# Patient Record
Sex: Male | Born: 1970 | Race: White | Hispanic: No | Marital: Married | Smoking: Never smoker
Health system: Southern US, Community
[De-identification: ages and names within clinical notes are randomized; demographics above are authoritative.]

## PROBLEM LIST (undated history)

## (undated) DIAGNOSIS — E119 Type 2 diabetes mellitus without complications: Secondary | ICD-10-CM

## (undated) HISTORY — PX: HERNIA REPAIR: SHX51

---

## 2015-07-22 DIAGNOSIS — F1020 Alcohol dependence, uncomplicated: Secondary | ICD-10-CM | POA: Insufficient documentation

## 2018-03-15 ENCOUNTER — Emergency Department (HOSPITAL_COMMUNITY): Payer: 59

## 2018-03-15 ENCOUNTER — Other Ambulatory Visit: Payer: Self-pay

## 2018-03-15 ENCOUNTER — Encounter (HOSPITAL_COMMUNITY): Payer: Self-pay | Admitting: Emergency Medicine

## 2018-03-15 ENCOUNTER — Observation Stay (HOSPITAL_COMMUNITY)
Admission: EM | Admit: 2018-03-15 | Discharge: 2018-03-16 | Disposition: A | Discharge status: Home / Self Care | Payer: 59 | Attending: Family Medicine | Admitting: Family Medicine

## 2018-03-15 DIAGNOSIS — Z791 Long term (current) use of non-steroidal anti-inflammatories (NSAID): Secondary | ICD-10-CM | POA: Insufficient documentation

## 2018-03-15 DIAGNOSIS — I8500 Esophageal varices without bleeding: Secondary | ICD-10-CM | POA: Diagnosis not present

## 2018-03-15 DIAGNOSIS — E119 Type 2 diabetes mellitus without complications: Secondary | ICD-10-CM | POA: Diagnosis not present

## 2018-03-15 DIAGNOSIS — R058 Other specified cough: Secondary | ICD-10-CM

## 2018-03-15 DIAGNOSIS — Z79899 Other long term (current) drug therapy: Secondary | ICD-10-CM | POA: Diagnosis not present

## 2018-03-15 DIAGNOSIS — K529 Noninfective gastroenteritis and colitis, unspecified: Principal | ICD-10-CM | POA: Diagnosis present

## 2018-03-15 DIAGNOSIS — R05 Cough: Secondary | ICD-10-CM | POA: Insufficient documentation

## 2018-03-15 DIAGNOSIS — R109 Unspecified abdominal pain: Secondary | ICD-10-CM

## 2018-03-15 DIAGNOSIS — Z7984 Long term (current) use of oral hypoglycemic drugs: Secondary | ICD-10-CM | POA: Insufficient documentation

## 2018-03-15 HISTORY — DX: Type 2 diabetes mellitus without complications: E11.9

## 2018-03-15 LAB — COMPREHENSIVE METABOLIC PANEL
ALT: 46 U/L — ABNORMAL HIGH (ref 0–44)
ANION GAP: 9 (ref 5–15)
AST: 41 U/L (ref 15–41)
Albumin: 3.9 g/dL (ref 3.5–5.0)
Alkaline Phosphatase: 118 U/L (ref 38–126)
BUN: 6 mg/dL (ref 6–20)
CO2: 24 mmol/L (ref 22–32)
Calcium: 9.4 mg/dL (ref 8.9–10.3)
Chloride: 104 mmol/L (ref 98–111)
Creatinine, Ser: 0.74 mg/dL (ref 0.61–1.24)
GFR calc non Af Amer: >60 mL/min (ref >60)
Glucose, Bld: 142 mg/dL — ABNORMAL HIGH (ref 70–99)
Potassium: 4 mmol/L (ref 3.5–5.1)
Sodium: 137 mmol/L (ref 135–145)
Total Bilirubin: 1 mg/dL (ref 0.3–1.2)
Total Protein: 7.9 g/dL (ref 6.5–8.1)

## 2018-03-15 LAB — CBC WITH DIFFERENTIAL/PLATELET
Abs Immature Granulocytes: 0.04 10*3/uL (ref 0.00–0.07)
Basophils Absolute: 0.1 10*3/uL (ref 0.0–0.1)
Basophils Relative: 1 %
Eosinophils Absolute: 0.2 10*3/uL (ref 0.0–0.5)
Eosinophils Relative: 3 %
HCT: 49.3 % (ref 39.0–52.0)
Hemoglobin: 16.6 g/dL (ref 13.0–17.0)
Immature Granulocytes: 1 %
Lymphocytes Relative: 20 %
Lymphs Abs: 1.7 10*3/uL (ref 0.7–4.0)
MCH: 31.7 pg (ref 26.0–34.0)
MCHC: 33.7 g/dL (ref 30.0–36.0)
MCV: 94.3 fL (ref 80.0–100.0)
Monocytes Absolute: 1.1 10*3/uL — ABNORMAL HIGH (ref 0.1–1.0)
Monocytes Relative: 13 %
Neutro Abs: 5.5 10*3/uL (ref 1.7–7.7)
Neutrophils Relative %: 62 %
Platelets: 239 10*3/uL (ref 150–400)
RBC: 5.23 MIL/uL (ref 4.22–5.81)
RDW: 12.2 % (ref 11.5–15.5)
WBC: 8.7 10*3/uL (ref 4.0–10.5)
nRBC: 0 % (ref 0.0–0.2)

## 2018-03-15 LAB — URINALYSIS, ROUTINE W REFLEX MICROSCOPIC
BILIRUBIN URINE: NEGATIVE
Glucose, UA: NEGATIVE mg/dL
Hgb urine dipstick: NEGATIVE
Ketones, ur: NEGATIVE mg/dL
Leukocytes, UA: NEGATIVE
Nitrite: NEGATIVE
Protein, ur: NEGATIVE mg/dL
Specific Gravity, Urine: >1.046 — ABNORMAL HIGH (ref 1.005–1.030)
pH: 5 (ref 5.0–8.0)

## 2018-03-15 LAB — LIPASE, BLOOD: LIPASE: 95 U/L — AB (ref 11–51)

## 2018-03-15 MED ORDER — ALBUTEROL SULFATE (2.5 MG/3ML) 0.083% IN NEBU
2.5000 mg | INHALATION_SOLUTION | Freq: Two times a day (BID) | RESPIRATORY_TRACT | Status: DC
  Administered 2018-03-16: 2.5 mg via RESPIRATORY_TRACT

## 2018-03-15 MED ORDER — DICYCLOMINE HCL 10 MG PO CAPS
10.0000 mg | ORAL_CAPSULE | Freq: Three times a day (TID) | ORAL | Status: DC
  Administered 2018-03-16 (×2): 10 mg via ORAL
  Filled 2018-03-15 (×2): qty 1

## 2018-03-15 MED ORDER — METRONIDAZOLE 500 MG PO TABS
500.0000 mg | ORAL_TABLET | Freq: Three times a day (TID) | ORAL | Status: DC
  Administered 2018-03-15 – 2018-03-16 (×3): 500 mg via ORAL
  Filled 2018-03-15 (×3): qty 1

## 2018-03-15 MED ORDER — SODIUM CHLORIDE (PF) 0.9 % IJ SOLN
INTRAMUSCULAR | Status: AC
Start: 2018-03-15 — End: 2018-03-16
  Filled 2018-03-15: qty 50

## 2018-03-15 MED ORDER — IOPAMIDOL (ISOVUE-300) INJECTION 61%
100.0000 mL | Freq: Once | INTRAVENOUS | Status: AC | PRN
  Administered 2018-03-15: 100 mL via INTRAVENOUS

## 2018-03-15 MED ORDER — SODIUM CHLORIDE 0.9 % IV SOLN
INTRAVENOUS | Status: DC
  Administered 2018-03-15 – 2018-03-16 (×2): via INTRAVENOUS

## 2018-03-15 MED ORDER — BENZONATATE 100 MG PO CAPS
100.0000 mg | ORAL_CAPSULE | Freq: Three times a day (TID) | ORAL | Status: DC | PRN
  Administered 2018-03-16 (×2): 100 mg via ORAL
  Filled 2018-03-15 (×2): qty 1

## 2018-03-15 MED ORDER — HYDROMORPHONE HCL 1 MG/ML IJ SOLN
1.0000 mg | INTRAMUSCULAR | Status: DC | PRN
  Administered 2018-03-15: 1 mg via INTRAVENOUS
  Filled 2018-03-15: qty 1

## 2018-03-15 MED ORDER — GUAIFENESIN-DM 100-10 MG/5ML PO SYRP
5.0000 mL | ORAL_SOLUTION | ORAL | Status: DC | PRN
  Administered 2018-03-16: 5 mL via ORAL
  Filled 2018-03-15: qty 10

## 2018-03-15 MED ORDER — ENOXAPARIN SODIUM 40 MG/0.4ML ~~LOC~~ SOLN
40.0000 mg | SUBCUTANEOUS | Status: DC
  Administered 2018-03-16: 40 mg via SUBCUTANEOUS
  Filled 2018-03-15: qty 0.4

## 2018-03-15 MED ORDER — FOLIC ACID 1 MG PO TABS
1.0000 mg | ORAL_TABLET | Freq: Every day | ORAL | Status: DC
  Administered 2018-03-16: 1 mg via ORAL
  Filled 2018-03-15: qty 1

## 2018-03-15 MED ORDER — IOPAMIDOL (ISOVUE-300) INJECTION 61%
INTRAVENOUS | Status: AC
  Filled 2018-03-15: qty 100

## 2018-03-15 MED ORDER — CIPROFLOXACIN HCL 500 MG PO TABS
500.0000 mg | ORAL_TABLET | Freq: Two times a day (BID) | ORAL | Status: DC
  Administered 2018-03-16 (×2): 500 mg via ORAL
  Filled 2018-03-15 (×2): qty 1

## 2018-03-15 MED ORDER — HYDROMORPHONE HCL 1 MG/ML IJ SOLN
1.0000 mg | Freq: Once | INTRAMUSCULAR | Status: AC
  Administered 2018-03-15: 1 mg via INTRAVENOUS
  Filled 2018-03-15: qty 1

## 2018-03-15 MED ORDER — SODIUM CHLORIDE 0.9 % IV BOLUS
1000.0000 mL | Freq: Once | INTRAVENOUS | Status: AC
  Administered 2018-03-15: 1000 mL via INTRAVENOUS

## 2018-03-15 MED ORDER — KETOROLAC TROMETHAMINE 30 MG/ML IJ SOLN: 30.0000 mg | Freq: Two times a day (BID) | INTRAMUSCULAR | Status: DC | PRN

## 2018-03-15 MED ORDER — MORPHINE SULFATE (PF) 4 MG/ML IV SOLN
6.0000 mg | Freq: Once | INTRAVENOUS | Status: AC
  Administered 2018-03-15: 6 mg via INTRAVENOUS
  Filled 2018-03-15: qty 2

## 2018-03-15 MED ORDER — ONDANSETRON HCL 4 MG/2ML IJ SOLN
4.0000 mg | Freq: Four times a day (QID) | INTRAMUSCULAR | Status: DC | PRN
  Administered 2018-03-16: 4 mg via INTRAVENOUS
  Filled 2018-03-15: qty 2

## 2018-03-15 MED ORDER — QUETIAPINE FUMARATE 25 MG PO TABS
25.0000 mg | ORAL_TABLET | Freq: Every day | ORAL | Status: DC
  Administered 2018-03-15: 25 mg via ORAL
  Filled 2018-03-15: qty 1

## 2018-03-15 MED ORDER — ALBUTEROL SULFATE HFA 108 (90 BASE) MCG/ACT IN AERS: 1.0000 | INHALATION_SPRAY | Freq: Two times a day (BID) | RESPIRATORY_TRACT | Status: DC

## 2018-03-15 MED ORDER — TRAMADOL HCL 50 MG PO TABS
50.0000 mg | ORAL_TABLET | Freq: Four times a day (QID) | ORAL | Status: DC | PRN
  Administered 2018-03-16: 50 mg via ORAL
  Filled 2018-03-15: qty 1

## 2018-03-15 MED ORDER — ACETAMINOPHEN 325 MG PO TABS: 650.0000 mg | ORAL_TABLET | Freq: Four times a day (QID) | ORAL | Status: DC | PRN

## 2018-03-15 MED ORDER — ONDANSETRON HCL 4 MG/2ML IJ SOLN
4.0000 mg | Freq: Once | INTRAMUSCULAR | Status: AC
  Administered 2018-03-15: 4 mg via INTRAVENOUS
  Filled 2018-03-15: qty 2

## 2018-03-15 MED ORDER — VITAMIN B-1 100 MG PO TABS
100.0000 mg | ORAL_TABLET | Freq: Every day | ORAL | Status: DC
  Administered 2018-03-16: 100 mg via ORAL
  Filled 2018-03-15: qty 1

## 2018-03-15 NOTE — ED Notes (Signed)
Bed: WA18 Expected date:  Expected time:  Means of arrival:  Comments: Abd pain 

## 2018-03-15 NOTE — ED Notes (Signed)
Patient transported to CT 

## 2018-03-15 NOTE — ED Provider Notes (Signed)
[facility]-EMERGENCY DEPT Provider Note   CSN: [id number] Arrival date & time: 03/15/18  1711     History   Chief Complaint Chief Complaint  Patient presents with  . Abdominal Pain    HPI Amaje Igou is a 48 y.o. male.  48 year old male presents with acute onset of epigastric and diffuse abdominal discomfort which began just prior to arrival.  Patient describes the pain as sharp and worse with any movement.  He has had nausea but no vomiting.  No fever or chills.  Denies any hematuria.  No prior history of same.  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  Denies any change to his bowel habits.  EMS called and patient transported for further evaluation     Past Medical History:  Diagnosis Date  . Diabetes mellitus without complication (HCC)     There are no active problems to display for this patient.   History reviewed. No pertinent surgical history.      Home Medications    Prior to Admission medications   Not on File    Family History History reviewed. No pertinent family history.  Social History Social History   Tobacco Use  . Smoking status: Never Smoker  . Smokeless tobacco: Former Neurosurgeon    Types: Chew  Substance Use Topics  . Alcohol use: Not Currently    Comment: in treatment at this time  . Drug use: Not Currently    Types: Marijuana     Allergies   Patient has no allergy information on record.   Review of Systems Review of Systems  All other systems reviewed and are negative.    Physical Exam Updated Vital Signs BP (!) 128/97   Pulse (!) 101   Temp 98.5 F (36.9 C) (Oral)   Resp 11   Ht 1.791 m (5' 10.5")   Wt 93 kg   SpO2 98%   BMI 29.00 kg/m   Physical Exam Vitals signs and nursing note reviewed.  Constitutional:      General: He is not in acute distress.    Appearance: Normal appearance. He is well-developed. He is not toxic-appearing.  HENT:   Head: Normocephalic and atraumatic.  Eyes:     General: Lids are normal.     Conjunctiva/sclera: Conjunctivae normal.     Pupils: Pupils are equal, round, and reactive to light.  Neck:     Musculoskeletal: Normal range of motion and neck supple.     Thyroid: No thyroid mass.     Trachea: No tracheal deviation.  Cardiovascular:     Rate and Rhythm: Normal rate and regular rhythm.     Heart sounds: Normal heart sounds. No murmur. No gallop.   Pulmonary:     Effort: Pulmonary effort is normal. No respiratory distress.     Breath sounds: Normal breath sounds. No stridor. No decreased breath sounds, wheezing, rhonchi or rales.  Abdominal:     General: Bowel sounds are normal. There is no distension.     Palpations: Abdomen is soft.     Tenderness: There is generalized abdominal tenderness. There is guarding. There is no rebound.  Musculoskeletal: Normal range of motion.        General: No tenderness.  Skin:    General: Skin is warm and dry.     Findings: No abrasion or rash.  Neurological:     Mental Status: He is alert and oriented to person, place, and time.  GCS: GCS eye subscore is 4. GCS verbal subscore is 5. GCS motor subscore is 6.     Cranial Nerves: No cranial nerve deficit.     Sensory: No sensory deficit.  Psychiatric:        Speech: Speech normal.        Behavior: Behavior normal.      ED Treatments / Results  Labs (all labs ordered are listed, but only abnormal results are displayed) Labs Reviewed  CBC WITH DIFFERENTIAL/PLATELET  COMPREHENSIVE METABOLIC PANEL  LIPASE, BLOOD  URINALYSIS, ROUTINE W REFLEX MICROSCOPIC    EKG None  Radiology No results found.  Procedures Procedures (including critical care time)  Medications Ordered in ED Medications  sodium chloride 0.9 % bolus 1,000 mL (has no administration in time range)  0.9 %  sodium chloride infusion (has no administration in time range)  morphine 4 MG/ML injection 6 mg (has no administration  in time range)  ondansetron (ZOFRAN) injection 4 mg (has no administration in time range)     Initial Impression / Assessment and Plan / ED Course  I have reviewed the triage vital signs and the nursing notes.  Pertinent labs & imaging results that were available during my care of the patient were reviewed by me and considered in my medical decision making (see chart for details).     Patient given IV fluids here as well as pain medication.  Abdominal CT without evidence of appendicitis but does show severe enteritis.  Patient remains tachycardic here.  Will admit to the hospitalist service  Final Clinical Impressions(s) / ED Diagnoses   Final diagnoses:  None    ED Discharge Orders    None       Lorre NickAllen, Dahlila Pfahler, MD 03/15/18 2046

## 2018-03-15 NOTE — ED Triage Notes (Signed)
Patient BIB [facility] from [facility], c/o severe abdominal. Some nausea, no vomiting. Intermittent, sharp, stabbing pain, center of abdomen through to back.

## 2018-03-15 NOTE — ED Notes (Signed)
ED TO INPATIENT HANDOFF REPORT  Name/Age/Gender Jay Beck 48 y.o. male  Code Status    Code Status Orders  (From admission, onward)         Start     Ordered   03/15/18 2132  Full code  Continuous     03/15/18 2132        Code Status History    This patient has a current code status but no historical code status.      Home/SNF/Other Home  Chief Complaint Abdominal pain   Level of Care/Admitting Diagnosis ED Disposition    ED Disposition Condition Comment   Admit  Hospital Area: [facility] [[id number]]  Level of Care: Med-Surg [16]  Diagnosis: Enteritis [932355]  Admitting Physician: Ike Bene [[id number]]  Attending Physician: Ike Bene [[id number]]  PT Class (Do Not Modify): Observation [104]  PT Acc Code (Do Not Modify): Observation [10022]       Medical History Past Medical History:  Diagnosis Date  . Diabetes mellitus without complication (HCC)     Allergies No Known Allergies  IV Location/Drains/Wounds Patient Lines/Drains/Airways Status   Active Line/Drains/Airways    Name:   Placement date:   Placement time:   Site:   Days:   Peripheral IV 03/15/18 Left Forearm   03/15/18    1750    Forearm   less than 1          Labs/Imaging Results for orders placed or performed during the hospital encounter of 03/15/18 (from the past 48 hour(s))  CBC with Differential/Platelet     Status: Abnormal   Collection Time: 03/15/18  5:46 PM  Result Value Ref Range   WBC 8.7 4.0 - 10.5 K/uL   RBC 5.23 4.22 - 5.81 MIL/uL   Hemoglobin 16.6 13.0 - 17.0 g/dL   HCT 76.2 83.1 - 51.7 %   MCV 94.3 80.0 - 100.0 fL   MCH 31.7 26.0 - 34.0 pg   MCHC 33.7 30.0 - 36.0 g/dL   RDW 61.6 07.3 - 71.0 %   Platelets 239 150 - 400 K/uL   nRBC 0.0 0.0 - 0.2 %   Neutrophils Relative % 62 %   Neutro Abs 5.5 1.7 - 7.7 K/uL   Lymphocytes Relative 20 %   Lymphs Abs 1.7 0.7 - 4.0 K/uL   Monocytes Relative 13 %   Monocytes Absolute 1.1 (H) 0.1 -  1.0 K/uL   Eosinophils Relative 3 %   Eosinophils Absolute 0.2 0.0 - 0.5 K/uL   Basophils Relative 1 %   Basophils Absolute 0.1 0.0 - 0.1 K/uL   Immature Granulocytes 1 %   Abs Immature Granulocytes 0.04 0.00 - 0.07 K/uL    Comment: Performed at [facility], [address] [place], [place] [address]  Comprehensive metabolic panel     Status: Abnormal   Collection Time: 03/15/18  5:46 PM  Result Value Ref Range   Sodium 137 135 - 145 mmol/L   Potassium 4.0 3.5 - 5.1 mmol/L   Chloride 104 98 - 111 mmol/L   CO2 24 22 - 32 mmol/L   Glucose, Bld 142 (H) 70 - 99 mg/dL   BUN 6 6 - 20 mg/dL   Creatinine, Ser 8.54 0.61 - 1.24 mg/dL   Calcium 9.4 8.9 - 62.7 mg/dL   Total Protein 7.9 6.5 - 8.1 g/dL   Albumin 3.9 3.5 - 5.0 g/dL   AST 41 15 - 41 U/L   ALT 46 (H) 0 -  44 U/L   Alkaline Phosphatase 118 38 - 126 U/L   Total Bilirubin 1.0 0.3 - 1.2 mg/dL   GFR calc non Af Amer >60 >60 mL/min   GFR calc Af Amer >60 >60 mL/min   Anion gap 9 5 - 15    Comment: Performed at [facility], [address] [place], [place] [address]  Lipase, blood     Status: Abnormal   Collection Time: 03/15/18  5:46 PM  Result Value Ref Range   Lipase 95 (H) 11 - 51 U/L    Comment: Performed at [facility], [address] [place], [place] [address]   Ct Abdomen Pelvis W Contrast  Result Date: 03/15/2018 CLINICAL DATA:  48 year old male with acute abdominal pain and distension today. EXAM: CT ABDOMEN AND PELVIS WITH CONTRAST TECHNIQUE: Multidetector CT imaging of the abdomen and pelvis was performed using the standard protocol following bolus administration of intravenous contrast. CONTRAST:  100mL ISOVUE-300 IOPAMIDOL (ISOVUE-300) INJECTION 61% COMPARISON:  None. FINDINGS: Lower chest: No acute abnormality Hepatobiliary: The liver is unremarkable. Cholelithiasis identified without CT evidence of acute cholecystitis. No biliary dilatation. Pancreas:  Unremarkable Spleen: Spleen is UPPER limits normal size Adrenals/Urinary Tract: The kidneys, adrenal glands and bladder are unremarkable. Stomach/Bowel: Wall thickening of the mid/distal small bowel loops within the RIGHT abdomen are noted with adjacent inflammation and free fluid, compatible with an enteritis. The terminal ileum does not appear involved. No bowel obstruction, pneumoperitoneum or abscess identified. Vascular/Lymphatic: The mesenteric arteries are patent. 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 A 1.5 cm celiac node is identified. Aortic atherosclerotic calcifications noted without evidence of aortic aneurysm. Reproductive: Unremarkable Other: A small amount of ascites adjacent to the liver, spleen and within the pelvis noted. Musculoskeletal: No acute or suspicious bony abnormalities. IMPRESSION: 1. Wall thickening of multiple mid/distal small bowel loops within the RIGHT abdomen without definite involvement of the terminal ileum. Moderate adjacent inflammation and small amount of ascites within the abdomen and pelvis. These findings are compatible with an enteritis of uncertain etiology. Mesenteric vessels are patent. 2. 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 3. UPPER limits normal spleen size.  No hepatic abnormalities noted. 4. Cholelithiasis without CT evidence of acute cholecystitis. 5.  Aortic Atherosclerosis (ICD10-I70.0). Electronically Signed   By: Harmon PierJeffrey  Hu M.D.   On: 03/15/2018 20:02   Dg Abd Acute 2+v W 1v Chest  Result Date: 03/15/2018 CLINICAL DATA:  Severe umbilical abdominal pain.  Some nausea. EXAM: DG ABDOMEN ACUTE W/ 1V CHEST COMPARISON:  None. FINDINGS: There is no bowel dilation to suggest obstruction. There are few air-fluid levels on the decubitus view, nonspecific. There is no free air. No evidence of renal or ureteral stones. Soft tissues are unremarkable. Normal heart, mediastinum and hila.  Clear lungs. No acute or significant skeletal abnormality. IMPRESSION:  1. No acute findings.  No evidence of bowel obstruction or free air. 2. Few nonspecific air-fluid levels on the decubitus view. Possible mild adynamic ileus versus gastroenteritis. 3. No active disease of the chest. Electronically Signed   By: Amie Portlandavid  Ormond M.D.   On: 03/15/2018 18:20   None  Pending Labs Unresulted Labs (From admission, onward)    Start     Ordered   03/16/18 0500  Comprehensive metabolic panel  Tomorrow morning,   R     03/15/18 2132   03/16/18 0500  CBC  Tomorrow morning,   R     03/15/18 2132   03/15/18 1739  Urinalysis, Routine w  reflex microscopic  Once,   R     03/15/18 1739          Vitals/Pain Today's Vitals   03/15/18 1835 03/15/18 2004 03/15/18 2005 03/15/18 2130  BP:  (!) 134/101  (!) 128/93  Pulse:  (!) 119  (!) 113  Resp:  15  (!) 22  Temp:      TempSrc:      SpO2:  100%  98%  Weight:      Height:      PainSc: 10-Worst pain ever  10-Worst pain ever     Isolation Precautions No active isolations  Medications Medications  0.9 %  sodium chloride infusion ( Intravenous Rate/Dose Verify 03/15/18 2145)  iopamidol (ISOVUE-300) 61 % injection (has no administration in time range)  sodium chloride (PF) 0.9 % injection (has no administration in time range)  sodium chloride 0.9 % bolus 1,000 mL (has no administration in time range)  ondansetron (ZOFRAN) injection 4 mg (has no administration in time range)  HYDROmorphone (DILAUDID) injection 1 mg (has no administration in time range)  dicyclomine (BENTYL) capsule 10 mg (has no administration in time range)  QUEtiapine (SEROQUEL) tablet 25 mg (has no administration in time range)  folic acid (FOLVITE) tablet 1 mg (has no administration in time range)  thiamine (VITAMIN B-1) tablet 100 mg (has no administration in time range)  albuterol (PROVENTIL HFA;VENTOLIN HFA) 108 (90 Base) MCG/ACT inhaler 1 puff (has no administration in time range)  guaiFENesin-dextromethorphan (ROBITUSSIN DM) 100-10 MG/5ML syrup  5 mL (has no administration in time range)  benzonatate (TESSALON) capsule 100 mg (has no administration in time range)  ciprofloxacin (CIPRO) tablet 500 mg (has no administration in time range)  metroNIDAZOLE (FLAGYL) tablet 500 mg (has no administration in time range)  acetaminophen (TYLENOL) tablet 650 mg (has no administration in time range)  enoxaparin (LOVENOX) injection 40 mg (has no administration in time range)  traMADol (ULTRAM) tablet 50 mg (has no administration in time range)  sodium chloride 0.9 % bolus 1,000 mL (0 mLs Intravenous Stopped 03/15/18 1920)  morphine 4 MG/ML injection 6 mg (6 mg Intravenous Given 03/15/18 1753)  ondansetron (ZOFRAN) injection 4 mg (4 mg Intravenous Given 03/15/18 1752)  HYDROmorphone (DILAUDID) injection 1 mg (1 mg Intravenous Given 03/15/18 1834)  iopamidol (ISOVUE-300) 61 % injection 100 mL (100 mLs Intravenous Contrast Given 03/15/18 1934)  HYDROmorphone (DILAUDID) injection 1 mg (1 mg Intravenous Given 03/15/18 2008)    Mobility walks

## 2018-03-15 NOTE — ED Notes (Signed)
Pt returned from CT scan and told radiology that he needs more pain medications. Dr.Allen notified of pt request.

## 2018-03-15 NOTE — H&P (Signed)
History and Physical  Jay Beck RUE:[id number] DOB: 01/04/1971 DOA: 03/15/2018 1721  Referring physician: Cordelia Poche [facility] ED) PCP: Cleatis Pol[facility]) Outpatient Specialists: n/a  HISTORY   Chief Complaint: acute abdominal pain  HPI: Jay Beck is a 48 y.o. male with hx of DMT2 (on metformin only) and alcohol abuse who recently completed nearly 3 weeks of alcohol detox at [facility] and planned for discharge tomorrow presented with acute severe abdominal pain that began earlier this afternoon. Reported as severe cramping, constant that caused patient to double over onto the floor. Felt that he could not move due to the severity of the pain. +Associated nausea. No clear precipitating factors other than recent trial of naltrexone (at alcohol rehab center by psychiatry) that began yesterday. Patient had taken his 3rd dose total of naltrexone this AM. Otherwise, he reports normal bowel movement this AM. No diarrhea or bloody/melanotic/tarry stools. Denies contact with people having GI illness or symptoms. Patient does report 3 weeks of persistent dry cough preceded by URI symptoms -- although URI sx have since resolved, he is still having dry cough. No changes in diet (pt reports bland food served at detox center) and no recent travel.    Review of Systems:  + severe abdominal pain with associated nausea + dry cough x 3 weeks - no fevers/chills - no chest pain, dyspnea on exertion - no edema, PND, orthopnea - no nausea/vomiting; no tarry, melanotic or bloody stools - no dysuria, increased urinary frequency - no weight changes Rest of systems reviewed are negative, except as per above history.   ED course:  Vitals Blood pressure (!) 134/101, pulse (!) 119, temperature 98.5 F (36.9 C), temperature source Oral, resp. rate 15, height 5' 10.5" (1.791 m), weight 93 kg, SpO2 100 %.  Received NS x 1L and NS infusion at 125 cc/hr; zofran 4mg  IV x 1; morphine 6mg  x 1; dilaudid  1mg  x 2;  Past Medical History:  Diagnosis Date  . Diabetes mellitus without complication (HCC)    History reviewed. No pertinent surgical history.  Social History:  reports that he has never smoked. He has quit using smokeless tobacco.  His smokeless tobacco use included chew. He reports previous alcohol use. He reports previous drug use. Drug: Marijuana.  No Known Allergies  History reviewed. No pertinent family history.    Prior to Admission medications   Medication Sig Start Date End Date Taking? Authorizing Provider  naltrexone (DEPADE) 50 MG tablet Take 25 mg by mouth daily.   Yes [provider]  folic acid (FOLVITE) 1 MG tablet  12/25/17   [provider]  LORazepam (ATIVAN) 1 MG tablet  01/25/18   [provider]  metFORMIN (GLUCOPHAGE-XR) 500 MG 24 hr tablet  01/13/18   [provider]  naproxen (NAPROSYN) 500 MG tablet  02/22/18   [provider]  QUEtiapine (SEROQUEL) 25 MG tablet  03/09/18   [provider]    PHYSICAL EXAM   Temp:  [98.5 F (36.9 C)] 98.5 F (36.9 C) (02/02 1727) Pulse Rate:  [101-119] 119 (02/02 2004) Cardiac Rhythm: Sinus tachycardia (02/02 2005) Resp:  [11-15] 15 (02/02 2004) BP: (128-134)/(97-101) 134/101 (02/02 2004) SpO2:  [98 %-100 %] 100 % (02/02 2004) Weight:  [93 kg] 93 kg (02/02 1732)  BP (!) 134/101 (BP Location: Left Arm)   Pulse (!) 119   Temp 98.5 F (36.9 C) (Oral)   Resp 15   Ht 5' 10.5" (1.791 m)  Wt 93 kg   SpO2 100%   BMI 29.00 kg/m    GEN obese middle-aged caucasian male; sitting in bed, mildly uncomfortable due to abdom pain  HEENT NCAT EOM intact PERRL; clear oropharynx, no cervical LAD; dry mucus membranes  JVP estimated 4-5 cm H2O above RA; no HJR ; no carotid bruits b/l ;  CV regular normal rate; normal S1 and S2; no m/r/g or S3/S4; PMI non displaced; no parasternal heave  RESP CTA b/l; breathing unlabored and symmetric; intermittent dry cough ABD soft, non  distended, non rigid, mod tenderness and mild guarding over RLQ; +normoactive BS  EXT warm throughout b/l; no peripheral edema b/l  PULSES  DP and radials 2+ intact b/l  SKIN/MSK no rashes or lesions  NEURO/PSYCH AAOx4; no focal deficits; no tremor or tongue fasciculations   DATA   LABS ON ADMISSION:  Basic Metabolic Panel: Recent Labs  Lab 03/15/18 1746  NA 137  K 4.0  CL 104  CO2 24  GLUCOSE 142*  BUN 6  CREATININE 0.74  CALCIUM 9.4   CBC: Recent Labs  Lab 03/15/18 1746  WBC 8.7  NEUTROABS 5.5  HGB 16.6  HCT 49.3  MCV 94.3  PLT 239   Liver Function Tests: Recent Labs  Lab 03/15/18 1746  AST 41  ALT 46*  ALKPHOS 118  BILITOT 1.0  PROT 7.9  ALBUMIN 3.9   Recent Labs  Lab 03/15/18 1746  LIPASE 95*   No results for input(s): AMMONIA in the last 168 hours. Coagulation:  No results found for: INR, PROTIME No results found for: PTT Lactic Acid, Venous:  No results found for: LATICACIDVEN Cardiac Enzymes: No results for input(s): CKTOTAL, CKMB, CKMBINDEX, TROPONINI in the last 168 hours. Urinalysis: No results found for: COLORURINE, APPEARANCEUR, LABSPEC, PHURINE, GLUCOSEU, HGBUR, BILIRUBINUR, KETONESUR, PROTEINUR, UROBILINOGEN, NITRITE, LEUKOCYTESUR  BNP (last 3 results) No results for input(s): PROBNP in the last 8760 hours. CBG: No results for input(s): GLUCAP in the last 168 hours.  Radiological Exams on Admission: Ct Abdomen Pelvis W Contrast  Result Date: 03/15/2018 CLINICAL DATA:  48 year old male with acute abdominal pain and distension today. EXAM: CT ABDOMEN AND PELVIS WITH CONTRAST TECHNIQUE: Multidetector CT imaging of the abdomen and pelvis was performed using the standard protocol following bolus administration of intravenous contrast. CONTRAST:  ISOVUE-300 IOPAMIDOL (ISOVUE-300) INJECTION 61% COMPARISON:  None. FINDINGS: Lower chest: No acute abnormality Hepatobiliary: The liver is unremarkable. Cholelithiasis identified without CT  evidence of acute cholecystitis. No biliary dilatation. Pancreas: Unremarkable Spleen: Spleen is UPPER limits normal size Adrenals/Urinary Tract: The kidneys, adrenal glands and bladder are unremarkable. Stomach/Bowel: Wall thickening of the mid/distal small bowel loops within the RIGHT abdomen are noted with adjacent inflammation and free fluid, compatible with an enteritis. The terminal ileum does not appear involved. No bowel obstruction, pneumoperitoneum or abscess identified. Vascular/Lymphatic: The mesenteric arteries are patent. 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 A 1.5 cm celiac node is identified. Aortic atherosclerotic calcifications noted without evidence of aortic aneurysm. Reproductive: Unremarkable Other: A small amount of ascites adjacent to the liver, spleen and within the pelvis noted. Musculoskeletal: No acute or suspicious bony abnormalities. IMPRESSION: 1. Wall thickening of multiple mid/distal small bowel loops within the RIGHT abdomen without definite involvement of the terminal ileum. Moderate adjacent inflammation and small amount of ascites within the abdomen and pelvis. These findings are compatible with an enteritis of uncertain etiology. Mesenteric vessels are patent. 2. 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 3. UPPER limits normal spleen  size.  No hepatic abnormalities noted. 4. Cholelithiasis without CT evidence of acute cholecystitis. 5.  Aortic Atherosclerosis (ICD10-I70.0). Electronically Signed   By: Harmon Pier M.D.   On: 03/15/2018 20:02   Dg Abd Acute 2+v W 1v Chest  Result Date: 03/15/2018 CLINICAL DATA:  Severe umbilical abdominal pain.  Some nausea. EXAM: DG ABDOMEN ACUTE W/ 1V CHEST COMPARISON:  None. FINDINGS: There is no bowel dilation to suggest obstruction. There are few air-fluid levels on the decubitus view, nonspecific. There is no free air. No evidence of renal or ureteral stones. Soft tissues are unremarkable. Normal heart, mediastinum and hila.   Clear lungs. No acute or significant skeletal abnormality. IMPRESSION: 1. No acute findings.  No evidence of bowel obstruction or free air. 2. Few nonspecific air-fluid levels on the decubitus view. Possible mild adynamic ileus versus gastroenteritis. 3. No active disease of the chest. Electronically Signed   By: Amie Portland M.D.   On: 03/15/2018 18:20    EKG:  None obtained.   I have reviewed the patient's previous electronic chart records, labs, and other data.   ASSESSMENT AND PLAN   Assessment: Yunior Boxx is a 48 y.o. male with hx of DMT2 (on metformin only) and alcohol abuse who recently completed nearly 3 weeks of alcohol detox at [facility] and planned for discharge tomorrow presented with acute severe abdominal pain -- found to have extensive R small bowel enteritis. Unclear etiology since no reported risk factors for viral GI illness, although patient is convinced it is related to naltrexone which was started at the alcohol detox center 1 day prior to symptoms. Will empirically treat with cipro + flagyl and continue supportive tx for pain control and nausea.   Active Problems:   Enteritis  Plan:   # Enteritis of R abdomen - suspect viral gastroenteritis or medication intolerance  - Continue IV fluids with another NS bolus (3L total) and maintenance @ 125 cc/hr - PO cipro and flagyl q8h x 3 day course (D1 03/15/17) - pain control: PO tramadol 50mg  q6h and dilaudid 1mg  IV q4h prn for breakthrough - start bentyl trial tomorrow - consider spot dose toradol for further pain control  - thin liquids diet tonight; can advance diet as tolerated tomorrow  # Recent alcohol detox, completed 3 weeks of alcohol detox/rehab. Out of window for withdrawal - resume nighttime seroquel (started by [facility] psych) - hold naltrexone in case of abdominal flare up - resume folate and thiamine - notify [facility] A[facility] re: naltrexone discontinuation  # Persistent dry cough x 3 weeks  preceded by URI symptoms  > mild bronchial thickening at bases on my review of CT; clear CXR on scout XR (CT abd/pelvis) - trial albuterol inhaler BID - guaifenesin and tessalon prn cough  # Esophageal varices and small ascites incidentally seen on CT abd/pelvis  - discussed with patient that he needs outpatient follow up with GI  - recommend establishing outpatient GI on discharge  # DMT2 (well-controlled) only on metformin  - hold metformin given enteritis at this time  - given NPO hold off on SSI or fingersticks at this time  - can resume on discharge when sx resolve  DVT Prophylaxis: lovenox  Code Status:  Full Code Family Communication: discussed with patient at bedside  Disposition Plan: observation and dispo pending clinical improvement; tolerating PO diet tomorrow  Patient contact: Extended Emergency Contact Information Primary Emergency Contact: SOLARICHIK,madline Address: [address]  [place], [place] [address] [place] Home Phone: [phone] Relation: Spouse Preferred language: English  Time spent: > 35 mins  Ike Benearolyn J Acire Tang, MD [facility] Pager [phone]  If 7PM-7AM, please contact night-coverage [email or web address] Password [email or web address] 03/15/2018, 9:37 PM

## 2018-03-16 DIAGNOSIS — R109 Unspecified abdominal pain: Secondary | ICD-10-CM

## 2018-03-16 LAB — CBC
HCT: 40 % (ref 39.0–52.0)
Hemoglobin: 13.2 g/dL (ref 13.0–17.0)
MCH: 31.8 pg (ref 26.0–34.0)
MCHC: 33 g/dL (ref 30.0–36.0)
MCV: 96.4 fL (ref 80.0–100.0)
Platelets: 186 10*3/uL (ref 150–400)
RBC: 4.15 MIL/uL — AB (ref 4.22–5.81)
RDW: 12.4 % (ref 11.5–15.5)
WBC: 9.8 10*3/uL (ref 4.0–10.5)
nRBC: 0 % (ref 0.0–0.2)

## 2018-03-16 LAB — COMPREHENSIVE METABOLIC PANEL
ALT: 31 U/L (ref 0–44)
ANION GAP: 6 (ref 5–15)
AST: 28 U/L (ref 15–41)
Albumin: 3.1 g/dL — ABNORMAL LOW (ref 3.5–5.0)
Alkaline Phosphatase: 80 U/L (ref 38–126)
BUN: 6 mg/dL (ref 6–20)
CALCIUM: 8 mg/dL — AB (ref 8.9–10.3)
CO2: 21 mmol/L — AB (ref 22–32)
Chloride: 108 mmol/L (ref 98–111)
Creatinine, Ser: 0.61 mg/dL (ref 0.61–1.24)
GFR calc Af Amer: >60 mL/min (ref >60)
GFR calc non Af Amer: >60 mL/min (ref >60)
Glucose, Bld: 165 mg/dL — ABNORMAL HIGH (ref 70–99)
Potassium: 3.9 mmol/L (ref 3.5–5.1)
Sodium: 135 mmol/L (ref 135–145)
Total Bilirubin: 0.6 mg/dL (ref 0.3–1.2)
Total Protein: 6 g/dL — ABNORMAL LOW (ref 6.5–8.1)

## 2018-03-16 LAB — GLUCOSE, CAPILLARY
Glucose-Capillary: 132 mg/dL — ABNORMAL HIGH (ref 70–99)
Glucose-Capillary: 156 mg/dL — ABNORMAL HIGH (ref 70–99)

## 2018-03-16 MED ORDER — HYDROMORPHONE HCL 1 MG/ML IJ SOLN
1.0000 mg | Freq: Once | INTRAMUSCULAR | Status: AC
  Administered 2018-03-16: 1 mg via INTRAVENOUS
  Filled 2018-03-16: qty 1

## 2018-03-16 MED ORDER — ALBUTEROL SULFATE (2.5 MG/3ML) 0.083% IN NEBU
INHALATION_SOLUTION | RESPIRATORY_TRACT | Status: AC
  Filled 2018-03-16: qty 3

## 2018-03-16 MED ORDER — ALBUTEROL SULFATE (2.5 MG/3ML) 0.083% IN NEBU: 2.5000 mg | INHALATION_SOLUTION | RESPIRATORY_TRACT | Status: DC | PRN

## 2018-03-16 MED ORDER — AMOXICILLIN-POT CLAVULANATE 875-125 MG PO TABS: 1.0000 | ORAL_TABLET | Freq: Two times a day (BID) | ORAL | 0 refills | Status: AC

## 2018-03-16 NOTE — Plan of Care (Signed)
  Problem: Coping: Goal: Ability to adjust to condition or change in health will improve Outcome: Progressing   Problem: Fluid Volume: Goal: Ability to maintain a balanced intake and output will improve Outcome: Progressing   Problem: Health Behavior/Discharge Planning: Goal: Ability to manage health-related needs will improve Outcome: Progressing   Problem: Clinical Measurements: Goal: Ability to maintain clinical measurements within normal limits will improve Outcome: Progressing Goal: Will remain free from infection Outcome: Progressing Goal: Diagnostic test results will improve Outcome: Progressing Goal: Respiratory complications will improve Outcome: Progressing Goal: Cardiovascular complication will be avoided Outcome: Progressing

## 2018-03-16 NOTE — Discharge Summary (Addendum)
Physician Discharge Summary  Jasyah Spoonemore ZOX:[id number] DOB: 1970-03-12 DOA: 03/15/2018  PCP: Patient, No Pcp Per  Admit date: 03/15/2018 Discharge date: 03/16/2018  Time spent: 25 minutes  Recommendations for Outpatient Follow-up:  1. Patient will need further detox planning as per [facility] 2. Needs Chem-12 and CBC and magnesium in 1 week 3. Would not use naltrexone going forward  Discharge Diagnoses:  Active Problems:   Enteritis   Discharge Condition: Improved  Diet recommendation: Heart healthy  Filed Weights   03/15/18 1732  Weight: 93 kg    History of present illness:  48 year old Caucasian male with history of type 2 diabetes mellitus admitted from [facility] 2/2 with severe abdominal pain in setting of recent use of naltrexone-apparently had been placed on this because of long-term abstinence from alcoholism does not use opiates Also recent URI symptoms He came to the emergency room with severe abdominal pain and was worked up and CT scan showed an enteritis-though he had no fever and only had one episode of diarrhea it was felt the differential was consistent with possible infectious enteritis versus effect of naltrexone He was stabilized on day of discharge 2/3 given prescription for Augmentin in case this is infectious etiology and encouraged to follow-up with his providers at [facility]   Discharge Exam: Vitals:   03/16/18 0531 03/16/18 0822  BP: (!) 92/55 (!) 134/91  Pulse: 90   Resp: 18   Temp: 98.2 F (36.8 C)   SpO2: 97%     General: Awake alert pleasant no distress Cardiovascular: S1-S2 no murmur rub or gallop Respiratory: Clinically clear no added sound no rales no rhonchi Abdomen soft slight tenderness and distention to abdomen but no rebound No lower extremity edema Neurologically intact Psych euthymic  Discharge Instructions   Discharge Instructions    Diet - low sodium heart healthy   Complete by:  As directed     Discharge instructions   Complete by:  As directed    You were admitted for probably a reaction to naltrexone-I would not recommend that you take this if you had this effect We will prescribe you a couple of doses of Augmentin in case this is an infectious diarrhea however I do not think that is likely I do feel that you can continue your other meds in the outpatient setting as per [facility] I will and I will give you a prescription for this antibiotic  Best of luck on your quitting and cessation efforts   Increase activity slowly   Complete by:  As directed      Allergies as of 03/16/2018   No Known Allergies     Medication List    STOP taking these medications   diazepam 1 MG/ML solution Commonly known as:  VALIUM   lisinopril 10 MG tablet Commonly known as:  PRINIVIL,ZESTRIL   loperamide 2 MG tablet Commonly known as:  IMODIUM A-D   metFORMIN 500 MG 24 hr tablet Commonly known as:  GLUCOPHAGE-XR   naltrexone 50 MG tablet Commonly known as:  DEPADE   NAUSEA CONTROL 1.87-1.87-21.5 Soln   ondansetron 8 MG tablet Commonly known as:  ZOFRAN     TAKE these medications   acetaminophen 500 MG tablet Commonly known as:  TYLENOL Take 1,000 mg by mouth every 6 (six) hours as needed for moderate pain or fever.   alum & mag hydroxide-simeth 400-400-40 MG/5ML suspension Commonly known as:  MAALOX PLUS Take 5-20 mLs by mouth every 6 (six) hours as needed for  indigestion.   amoxicillin-clavulanate 875-125 MG tablet Commonly known as:  AUGMENTIN Take 1 tablet by mouth 2 (two) times daily for 4 days.   Benzocaine 10 MG Lozg Use as directed 1 lozenge in the mouth or throat every 4 (four) hours as needed (sore throat).   cetirizine 10 MG tablet Commonly known as:  ZYRTEC Take 10 mg by mouth daily as needed for allergies.   guaiFENesin 600 MG 12 hr tablet Commonly known as:  MUCINEX Take 600 mg by mouth 2 (two) times daily as needed for cough.   ibuprofen 600 MG  tablet Commonly known as:  ADVIL,MOTRIN Take 600 mg by mouth 4 (four) times daily as needed for fever.   MULTIPLE VITAMIN-FOLIC ACID PO Take 1 tablet by mouth daily.   phenylephrine 10 MG Tabs tablet Commonly known as:  SUDAFED PE Take 10 mg by mouth 3 (three) times daily as needed (congestion).   QUEtiapine 25 MG tablet Commonly known as:  SEROQUEL Take 25 mg by mouth at bedtime as needed (insomnia).   TESSALON PO Take 2 capsules by mouth every 8 (eight) hours as needed (cough).   thiamine 100 MG tablet Take 100 mg by mouth daily.      No Known Allergies    The results of significant diagnostics from this hospitalization (including imaging, microbiology, ancillary and laboratory) are listed below for reference.    Significant Diagnostic Studies: Ct Abdomen Pelvis W Contrast  Result Date: 03/15/2018 CLINICAL DATA:  48 year old male with acute abdominal pain and distension today. EXAM: CT ABDOMEN AND PELVIS WITH CONTRAST TECHNIQUE: Multidetector CT imaging of the abdomen and pelvis was performed using the standard protocol following bolus administration of intravenous contrast. CONTRAST:  ISOVUE-300 IOPAMIDOL (ISOVUE-300) INJECTION 61% COMPARISON:  None. FINDINGS: Lower chest: No acute abnormality Hepatobiliary: The liver is unremarkable. Cholelithiasis identified without CT evidence of acute cholecystitis. No biliary dilatation. Pancreas: Unremarkable Spleen: Spleen is UPPER limits normal size Adrenals/Urinary Tract: The kidneys, adrenal glands and bladder are unremarkable. Stomach/Bowel: Wall thickening of the mid/distal small bowel loops within the RIGHT abdomen are noted with adjacent inflammation and free fluid, compatible with an enteritis. The terminal ileum does not appear involved. No bowel obstruction, pneumoperitoneum or abscess identified. Vascular/Lymphatic: The mesenteric arteries are patent. 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 A 1.5 cm celiac node is identified. Aortic  atherosclerotic calcifications noted without evidence of aortic aneurysm. Reproductive: Unremarkable Other: A small amount of ascites adjacent to the liver, spleen and within the pelvis noted. Musculoskeletal: No acute or suspicious bony abnormalities. IMPRESSION: 1. Wall thickening of multiple mid/distal small bowel loops within the RIGHT abdomen without definite involvement of the terminal ileum. Moderate adjacent inflammation and small amount of ascites within the abdomen and pelvis. These findings are compatible with an enteritis of uncertain etiology. Mesenteric vessels are patent. 2. 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 3. UPPER limits normal spleen size.  No hepatic abnormalities noted. 4. Cholelithiasis without CT evidence of acute cholecystitis. 5.  Aortic Atherosclerosis (ICD10-I70.0). Electronically Signed   By: Harmon Pier M.D.   On: 03/15/2018 20:02   Dg Abd Acute 2+v W 1v Chest  Result Date: 03/15/2018 CLINICAL DATA:  Severe umbilical abdominal pain.  Some nausea. EXAM: DG ABDOMEN ACUTE W/ 1V CHEST COMPARISON:  None. FINDINGS: There is no bowel dilation to suggest obstruction. There are few air-fluid levels on the decubitus view, nonspecific. There is no free air. No evidence of renal or ureteral stones. Soft tissues are unremarkable. Normal heart, mediastinum  and hila.  Clear lungs. No acute or significant skeletal abnormality. IMPRESSION: 1. No acute findings.  No evidence of bowel obstruction or free air. 2. Few nonspecific air-fluid levels on the decubitus view. Possible mild adynamic ileus versus gastroenteritis. 3. No active disease of the chest. Electronically Signed   By: Amie Portlandavid  Ormond M.D.   On: 03/15/2018 18:20    Microbiology: No results found for this or any previous visit (from the past 240 hour(s)).   Labs: Basic Metabolic Panel: Recent Labs  Lab 03/15/18 1746 03/16/18 0539  NA 137 135  K 4.0 3.9  CL 104 108  CO2 24 21*  GLUCOSE 142* 165*  BUN 6 6   CREATININE 0.74 0.61  CALCIUM 9.4 8.0*   Liver Function Tests: Recent Labs  Lab 03/15/18 1746 03/16/18 0539  AST 41 28  ALT 46* 31  ALKPHOS 118 80  BILITOT 1.0 0.6  PROT 7.9 6.0*  ALBUMIN 3.9 3.1*   Recent Labs  Lab 03/15/18 1746  LIPASE 95*   No results for input(s): AMMONIA in the last 168 hours. CBC: Recent Labs  Lab 03/15/18 1746 03/16/18 0539  WBC 8.7 9.8  NEUTROABS 5.5  --   HGB 16.6 13.2  HCT 49.3 40.0  MCV 94.3 96.4  PLT 239 186   Cardiac Enzymes: No results for input(s): CKTOTAL, CKMB, CKMBINDEX, TROPONINI in the last 168 hours. BNP: BNP (last 3 results) No results for input(s): BNP in the last 8760 hours.  ProBNP (last 3 results) No results for input(s): PROBNP in the last 8760 hours.  CBG: Recent Labs  Lab 03/16/18 0040 03/16/18 0539  GLUCAP 132* 156*       Signed:  Rhetta MuraJai-Gurmukh Peniel Hass MD   [facility] 03/16/2018, 9:34 AM

## 2018-03-16 NOTE — Progress Notes (Addendum)
Patient gave CSW permission to send clinical to [facility]. Patient information faxed sent to [facility]. Per staff Tammy Sours, the patient information received and will need to be reviewed by the Nursing Director before admitting the patient back. The facility will transport the patient. Per [facility] staff, ETA, "a couple of hours."  CSW notified the patient nurse, Onalee Hua.  Vivi Barrack, Alexander [place], MSW Clinical Social Worker  [phone] 03/16/2018  1:10 PM

## 2018-03-16 NOTE — Progress Notes (Signed)
High HR is reported to hospitalist Bodenheimer.

## 2018-03-16 NOTE — Care Management Note (Signed)
Case Management Note  Patient Details  Name: Jay Beck MRN: [id number] Date of Birth: 01-02-1971  Subjective/Objective:                  Discharged to home  Action/Plan: No cm needs  Expected Discharge Date:  03/16/18               Expected Discharge Plan:  Home/Self Care  In-House Referral:     Discharge planning Services  CM Consult  Post Acute Care Choice:    Choice offered to:     DME Arranged:    DME Agency:     HH Arranged:    HH Agency:     Status of Service:  Completed, signed off  If discussed at [employer] of Stay Meetings, dates discussed:    Additional Comments:  Golda Acre, RN 03/16/2018, 11:45 AM

## 2018-03-17 ENCOUNTER — Other Ambulatory Visit: Payer: Self-pay

## 2018-03-17 ENCOUNTER — Emergency Department (HOSPITAL_COMMUNITY)
Admission: EM | Admit: 2018-03-17 | Discharge: 2018-03-17 | Disposition: A | Discharge status: Home / Self Care | Payer: 59 | Attending: Emergency Medicine | Admitting: Emergency Medicine

## 2018-03-17 ENCOUNTER — Emergency Department (HOSPITAL_COMMUNITY): Payer: 59

## 2018-03-17 ENCOUNTER — Encounter (HOSPITAL_COMMUNITY): Payer: Self-pay

## 2018-03-17 DIAGNOSIS — R1084 Generalized abdominal pain: Secondary | ICD-10-CM | POA: Insufficient documentation

## 2018-03-17 DIAGNOSIS — Z79899 Other long term (current) drug therapy: Secondary | ICD-10-CM | POA: Diagnosis not present

## 2018-03-17 DIAGNOSIS — E119 Type 2 diabetes mellitus without complications: Secondary | ICD-10-CM | POA: Insufficient documentation

## 2018-03-17 DIAGNOSIS — Z87891 Personal history of nicotine dependence: Secondary | ICD-10-CM | POA: Diagnosis not present

## 2018-03-17 DIAGNOSIS — R197 Diarrhea, unspecified: Secondary | ICD-10-CM | POA: Insufficient documentation

## 2018-03-17 LAB — LIPASE, BLOOD: Lipase: 80 U/L — ABNORMAL HIGH (ref 11–51)

## 2018-03-17 LAB — CBC
HCT: 42.1 % (ref 39.0–52.0)
Hemoglobin: 14.2 g/dL (ref 13.0–17.0)
MCH: 31.8 pg (ref 26.0–34.0)
MCHC: 33.7 g/dL (ref 30.0–36.0)
MCV: 94.2 fL (ref 80.0–100.0)
Platelets: 185 10*3/uL (ref 150–400)
RBC: 4.47 MIL/uL (ref 4.22–5.81)
RDW: 12.4 % (ref 11.5–15.5)
WBC: 7.6 10*3/uL (ref 4.0–10.5)
nRBC: 0 % (ref 0.0–0.2)

## 2018-03-17 LAB — URINALYSIS, ROUTINE W REFLEX MICROSCOPIC
Bilirubin Urine: NEGATIVE
GLUCOSE, UA: NEGATIVE mg/dL
Hgb urine dipstick: NEGATIVE
Ketones, ur: NEGATIVE mg/dL
Nitrite: NEGATIVE
Protein, ur: NEGATIVE mg/dL
Specific Gravity, Urine: 1.021 (ref 1.005–1.030)
pH: 5 (ref 5.0–8.0)

## 2018-03-17 LAB — COMPREHENSIVE METABOLIC PANEL
ALT: 33 U/L (ref 0–44)
AST: 29 U/L (ref 15–41)
Albumin: 3.8 g/dL (ref 3.5–5.0)
Alkaline Phosphatase: 97 U/L (ref 38–126)
Anion gap: 7 (ref 5–15)
BUN: 5 mg/dL — ABNORMAL LOW (ref 6–20)
CO2: 25 mmol/L (ref 22–32)
Calcium: 8.9 mg/dL (ref 8.9–10.3)
Chloride: 106 mmol/L (ref 98–111)
Creatinine, Ser: 0.76 mg/dL (ref 0.61–1.24)
GFR calc Af Amer: >60 mL/min (ref >60)
GFR calc non Af Amer: >60 mL/min (ref >60)
Glucose, Bld: 115 mg/dL — ABNORMAL HIGH (ref 70–99)
Potassium: 3.8 mmol/L (ref 3.5–5.1)
Sodium: 138 mmol/L (ref 135–145)
Total Bilirubin: 0.9 mg/dL (ref 0.3–1.2)
Total Protein: 7.1 g/dL (ref 6.5–8.1)

## 2018-03-17 MED ORDER — SODIUM CHLORIDE 0.9% FLUSH: 3.0000 mL | Freq: Once | INTRAVENOUS | Status: DC

## 2018-03-17 MED ORDER — SODIUM CHLORIDE 0.9 % IV BOLUS
1000.0000 mL | Freq: Once | INTRAVENOUS | Status: AC
  Administered 2018-03-17: 1000 mL via INTRAVENOUS

## 2018-03-17 MED ORDER — IOHEXOL 300 MG/ML  SOLN
30.0000 mL | Freq: Once | INTRAMUSCULAR | Status: AC | PRN
  Administered 2018-03-17: 30 mL via ORAL

## 2018-03-17 NOTE — ED Notes (Signed)
Spoke with [place] social work, informs nurse she is working on Cabin crew for pt discharge.

## 2018-03-17 NOTE — ED Provider Notes (Signed)
[facility]-EMERGENCY DEPT Provider Note   CSN: [id number] Arrival date & time: 03/17/18  [id number]     History   Chief Complaint Chief Complaint  Patient presents with  . Abdominal Pain  . Diarrhea    HPI Jay Beck is a 48 y.o. male.  48 year old male with prior medical history as detailed below presents from [facility] for evaluation of abdominal pain.  Patient reports that he was evaluated at this facility recently for similar pain.  He was admitted overnight for observation and then discharged yesterday.  Patient's abdominal pain was felt that time to be possibly secondary to enteritis versus effect of naltrexone.  Patient returns today after a brief episode of pain similar to his prior.  Upon arrival to the ED is now improved and without significant discomfort.  He denies associated fever, nausea, vomiting.  He reports intermittent loose bowel movements.  The history is provided by the patient and medical records.  Abdominal Pain  Pain location:  Generalized Pain quality: bloating and cramping   Pain radiates to:  Does not radiate Pain severity:  Mild Onset quality:  Gradual Duration:  3 days Timing:  Constant Progression:  Waxing and waning Chronicity:  New Relieved by:  Nothing Worsened by:  Nothing Ineffective treatments:  None tried Associated symptoms: diarrhea   Diarrhea  Associated symptoms: abdominal pain     Past Medical History:  Diagnosis Date  . Diabetes mellitus without complication [facility](HCC)     Patient Active Problem List   Diagnosis Date Noted  . Enteritis 03/15/2018  . Alcohol use disorder, moderate, dependence (HCC) 07/22/2015    Past Surgical History:  Procedure Laterality Date  . HERNIA REPAIR          Home Medications    Prior to Admission medications   Medication Sig Start Date End Date Taking? Authorizing Provider  alum & mag hydroxide-simeth (MAALOX PLUS) 400-400-40 MG/5ML suspension Take 5-20 mLs  by mouth every 6 (six) hours as needed for indigestion.   Yes [provider]  amoxicillin-clavulanate (AUGMENTIN) 875-125 MG tablet Take 1 tablet by mouth 2 (two) times daily for 4 days. 03/16/18 03/20/18 Yes Rhetta MuraSamtani, Jai-Gurmukh, MD  Benzocaine 10 MG LOZG Use as directed 1 lozenge in the mouth or throat every 4 (four) hours as needed (sore throat).   Yes [provider]  benzonatate (TESSALON) 100 MG capsule Take 100 mg by mouth every 8 (eight) hours as needed for cough.   Yes [provider]  cetirizine (ZYRTEC) 10 MG tablet Take 10 mg by mouth daily as needed for allergies.   Yes [provider]  diazepam (VALIUM) 5 MG/ML injection Inject 5 mg into the vein See admin instructions. Give as needed for seizures. May repeat x1 in 30 sec if seizure persists   Yes [provider]  guaiFENesin (MUCINEX) 600 MG 12 hr tablet Take 600 mg by mouth 2 (two) times daily as needed for cough.   Yes [provider]  hydrochlorothiazide (HYDRODIURIL) 12.5 MG tablet Take 12.5 mg by mouth daily.   Yes [provider]  ibuprofen (ADVIL,MOTRIN) 600 MG tablet Take 600 mg by mouth 4 (four) times daily as needed for fever.   Yes [provider]  ketorolac (TORADOL) 30 MG/ML injection Inject 30 mg into the vein Once PRN for mild pain.   Yes [provider]  MULTIPLE VITAMIN-FOLIC ACID PO Take 1 tablet by mouth daily.   Yes [provider]  ondansetron (ZOFRAN-ODT) 8 MG  disintegrating tablet Take 8 mg by mouth every 8 (eight) hours as needed for nausea or vomiting.   Yes [provider]  QUEtiapine (SEROQUEL) 25 MG tablet Take 25 mg by mouth at bedtime as needed (insomnia).  03/09/18  Yes [provider]  thiamine 100 MG tablet Take 100 mg by mouth daily.   Yes [provider]    Family History Family History  Problem Relation Age of Onset  . Heart failure Father     Social History Social History    Tobacco Use  . Smoking status: Never Smoker  . Smokeless tobacco: Former NeurosurgeonUser    Types: Chew  Substance Use Topics  . Alcohol use: Not Currently    Comment: in treatment at this time  . Drug use: Not Currently    Types: Marijuana     Allergies   Patient has no known allergies.   Review of Systems Review of Systems  Gastrointestinal: Positive for abdominal pain and diarrhea.  All other systems reviewed and are negative.    Physical Exam Updated Vital Signs BP (!) 138/98 (BP Location: Right Arm)   Pulse 85   Temp 98.4 F (36.9 C) (Oral)   Resp 15   Ht 5\' 11"  (1.803 m)   Wt 93 kg   SpO2 100%   BMI 28.59 kg/m   Physical Exam Vitals signs and nursing note reviewed.  Constitutional:      General: He is not in acute distress.    Appearance: He is well-developed.  HENT:     Head: Normocephalic and atraumatic.  Eyes:     Conjunctiva/sclera: Conjunctivae normal.     Pupils: Pupils are equal, round, and reactive to light.  Neck:     Musculoskeletal: Normal range of motion and neck supple.  Cardiovascular:     Rate and Rhythm: Normal rate and regular rhythm.     Heart sounds: Normal heart sounds.  Pulmonary:     Effort: Pulmonary effort is normal. No respiratory distress.     Breath sounds: Normal breath sounds.  Abdominal:     General: There is no distension.     Palpations: Abdomen is soft.     Tenderness: There is no abdominal tenderness.  Musculoskeletal: Normal range of motion.        General: No deformity.  Skin:    General: Skin is warm and dry.  Neurological:     General: No focal deficit present.     Mental Status: He is alert and oriented to person, place, and time.      ED Treatments / Results  Labs (all labs ordered are listed, but only abnormal results are displayed) Labs Reviewed  LIPASE, BLOOD - Abnormal; Notable for the following components:      Result Value   Lipase 80 (*)    All other components within normal limits   COMPREHENSIVE METABOLIC PANEL - Abnormal; Notable for the following components:   Glucose, Bld 115 (*)    BUN 5 (*)    All other components within normal limits  URINALYSIS, ROUTINE W REFLEX MICROSCOPIC - Abnormal; Notable for the following components:   Color, Urine AMBER (*)    Leukocytes, UA TRACE (*)    Bacteria, UA RARE (*)    All other components within normal limits  CBC    EKG None  Radiology Ct Abdomen Pelvis Wo Contrast  Result Date: 03/17/2018 CLINICAL DATA:  Upper and Lower abdominal pain onset Sunday evening and patient came to ER stayed  into Monday Pain has worsened and extends to up and down Oral contrast only Hx DM^33mL OMNIPAQUE IOHEXOL 300 MG/ML SOLN EXAM: CT ABDOMEN AND PELVIS WITHOUT CONTRAST TECHNIQUE: Multidetector CT imaging of the abdomen and pelvis was performed following the standard protocol without IV contrast. COMPARISON:  03/15/2018 FINDINGS: Lower chest: There is moderate atherosclerosis of the visualized coronary arteries. Heart size is normal. Lung bases are clear. Hepatobiliary: Perihepatic fluid stable since the prior study. There is mild nodular contour of the liver and prominent caudate lobe, raising the question of cirrhosis. The gallbladder is distended and contains calcified stones, layering dependently. Pancreas: Unremarkable. No pancreatic ductal dilatation or surrounding inflammatory changes. Spleen: The spleen is enlarged but homogeneous. Adrenals/Urinary Tract: Adrenal glands are normal in appearance. Symmetric size both kidneys. The ureters are unremarkable. No intrarenal stones. The bladder and visualized portion of the urethra are normal. Stomach/Bowel: Stomach has a normal appearance. There has been improvement in the appearance of small bowel mesenteric edema RIGHT LOWER QUADRANT. There is persistent mild thickening of the ileal loops in the RIGHT LOWER QUADRANT and adjacent mesenteric fluid. The terminal ileum is normal in appearance. Loops of  colon are unremarkable. Appendix is not seen. Vascular/Lymphatic: There is atherosclerotic calcification of the abdominal aorta not associated aneurysm. Nonspecific but increased mesenteric lymph nodes in the RIGHT LOWER QUADRANT. These measure on the order of 1 centimeter and smaller. A node adjacent to the celiac axis is 1.6 centimeters, probably unchanged since prior study. There are numerous LEFT UPPER QUADRANT varices, extending into the LOWER paraesophageal region. Reproductive: Prostate is unremarkable. Other: None Musculoskeletal: No acute or significant osseous findings. IMPRESSION: 1. The has been improvement in the amount of mesenteric fluid/edema in the RIGHT LOWER QUADRANT. 2. There are persistently thickened small bowel loops in the RIGHT LOWER QUADRANT, sparing the terminal ileum. 3. 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 4. Cholelithiasis and distended gallbladder. 5. Nonspecific and possibly reactive mesenteric lymph nodes. Electronically Signed   By: Norva Pavlov M.D.   On: 03/17/2018 13:15   Ct Abdomen Pelvis W Contrast  Result Date: 03/15/2018 CLINICAL DATA:  48 year old male with acute abdominal pain and distension today. EXAM: CT ABDOMEN AND PELVIS WITH CONTRAST TECHNIQUE: Multidetector CT imaging of the abdomen and pelvis was performed using the standard protocol following bolus administration of intravenous contrast. CONTRAST:  ISOVUE-300 IOPAMIDOL (ISOVUE-300) INJECTION 61% COMPARISON:  None. FINDINGS: Lower chest: No acute abnormality Hepatobiliary: The liver is unremarkable. Cholelithiasis identified without CT evidence of acute cholecystitis. No biliary dilatation. Pancreas: Unremarkable Spleen: Spleen is UPPER limits normal size Adrenals/Urinary Tract: The kidneys, adrenal glands and bladder are unremarkable. Stomach/Bowel: Wall thickening of the mid/distal small bowel loops  within the RIGHT abdomen are noted with adjacent inflammation and free fluid, compatible with an enteritis. The terminal ileum does not appear involved. No bowel obstruction, pneumoperitoneum or abscess identified. Vascular/Lymphatic: The mesenteric arteries are patent. 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 A 1.5 cm celiac node is identified. Aortic atherosclerotic calcifications noted without evidence of aortic aneurysm. Reproductive: Unremarkable Other: A small amount of ascites adjacent to the liver, spleen and within the pelvis noted. Musculoskeletal: No acute or suspicious bony abnormalities. IMPRESSION: 1. Wall thickening of multiple mid/distal small bowel loops within the RIGHT abdomen without definite involvement of the terminal ileum. Moderate adjacent inflammation and small amount of ascites within the abdomen and pelvis. These findings are compatible with an enteritis of uncertain etiology. Mesenteric vessels  are patent. 2. 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 3. UPPER limits normal spleen size.  No hepatic abnormalities noted. 4. Cholelithiasis without CT evidence of acute cholecystitis. 5.  Aortic Atherosclerosis (ICD10-I70.0). Electronically Signed   By: Harmon Pier M.D.   On: 03/15/2018 20:02   Dg Abd Acute 2+v W 1v Chest  Result Date: 03/15/2018 CLINICAL DATA:  Severe umbilical abdominal pain.  Some nausea. EXAM: DG ABDOMEN ACUTE W/ 1V CHEST COMPARISON:  None. FINDINGS: There is no bowel dilation to suggest obstruction. There are few air-fluid levels on the decubitus view, nonspecific. There is no free air. No evidence of renal or ureteral stones. Soft tissues are unremarkable. Normal heart, mediastinum and hila.  Clear lungs. No acute or significant skeletal abnormality. IMPRESSION: 1. No acute findings.  No evidence of bowel obstruction or free air. 2. Few nonspecific air-fluid levels on the decubitus view. Possible mild adynamic ileus versus gastroenteritis. 3. No active disease of  the chest. Electronically Signed   By: Amie Portland M.D.   On: 03/15/2018 18:20    Procedures Procedures (including critical care time)  Medications Ordered in ED Medications  sodium chloride flush (NS) 0.9 % injection 3 mL (has no administration in time range)  sodium chloride 0.9 % bolus 1,000 mL (1,000 mLs Intravenous New Bag/Given (Non-Interop) 03/17/18 1101)  iohexol (OMNIPAQUE) 300 MG/ML solution 30 mL (30 mLs Oral Contrast Given 03/17/18 1251)     Initial Impression / Assessment and Plan / ED Course  I have reviewed the triage vital signs and the nursing notes.  Pertinent labs & imaging results that were available during my care of the patient were reviewed by me and considered in my medical decision making (see chart for details).     MDM  Screen complete  Patient is presenting for reevaluation of abdominal discomfort.  Exam and history today are not consistent with significant acute pathology.  Repeat work-up in the ED today does not demonstrate significant pathology.  Repeat imaging also does not demonstrate acute process.  Patient does feel improved following his ED evaluation.  He does understand the need for close follow-up.  Strict return precautions given and understood.    Final Clinical Impressions(s) / ED Diagnoses   Final diagnoses:  Generalized abdominal pain    ED Discharge Orders    None       Wynetta Fines, MD 03/17/18 1346

## 2018-03-17 NOTE — Discharge Instructions (Addendum)
Please return for any problem.  Follow-up with your regular care provider as instructed.  Modify your diet as instructed.  Drink plenty of fluids.

## 2018-03-17 NOTE — Progress Notes (Addendum)
CSW aware of consult as patient is from [facility] and will need assistance with transportation. CSW called [facility] and spoke with Dois Davenport, Director of Nursing, who voiced concerns with patient's medical status. CSW explained EDP reevaluated patient and did a repeat work-up but did not find anything of concern. Dois Davenport requested CSW fax over documentation from this visit. CSW has done that at this time and awaits return call.  3:36pm- CSW received return call from [employer] who was requesting to speak with patient. CSW assisted in facilitating phone conference between patient and Dois Davenport. Patient informed CSW, once conversation was completed, that he will be returning home with his wife and kids versus returning to [facility]. Per patient, he would like to be closer to his PCP so that this issue could be monitored. CSW aware patient spoke with his wife who is en route to pick up patient. CSW escorted patient to main entrance lobby at the request of patient.   Archie Balboa, LCSWA  Clinical Social Work Department  [employer]  [phone]

## 2018-03-17 NOTE — ED Triage Notes (Addendum)
Per EMS- patient was at [facility] c/o abdominal pain. Patient has been there x 2 weeks. Patient was seen 2 days ago for the same. Patient states the pain meds prescribed have not helped his pain and antibiotics have not been started.  patient stated in triage that he has been having diarrhea.

## 2018-03-17 NOTE — ED Notes (Addendum)
Pt stated that he had his discharge paperwork and that he was good to go. Pt walked out without speaking to this Clinical research associate. V/S were not updated due to this. Pt was ambulatory without assistance. CAOx4 with last interaction.

## 2019-06-04 IMAGING — CT CT ABD-PELV W/O
2 of 4 series · 15 of 46 positions shown, 17 images · IV contrast (omnipaque)
Comparison: 03/15/2018

CLINICAL DATA: Upper and Lower abdominal pain onset [REDACTED] evening
and patient came to ER stayed into [REDACTED] Pain has worsened and
extends to up and down Oral contrast only Hx DM^30mL OMNIPAQUE
IOHEXOL 300 MG/ML SOLN

EXAM:
CT ABDOMEN AND PELVIS WITHOUT CONTRAST
TECHNIQUE: Multidetector CT imaging of the abdomen and pelvis was performed
following the standard protocol without IV contrast.

[Series 2: axial st · axial · 0.97mm/px · z∈[-688,-163]mm · 12 of 119 slices shown, 14 images]
[im 7/119  soft-tissue]
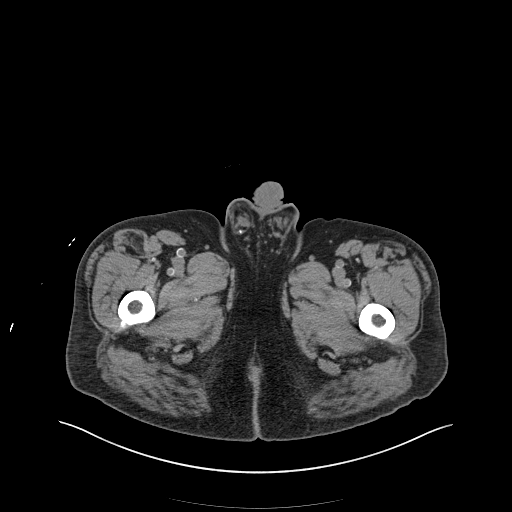
[im 7/119  bone]
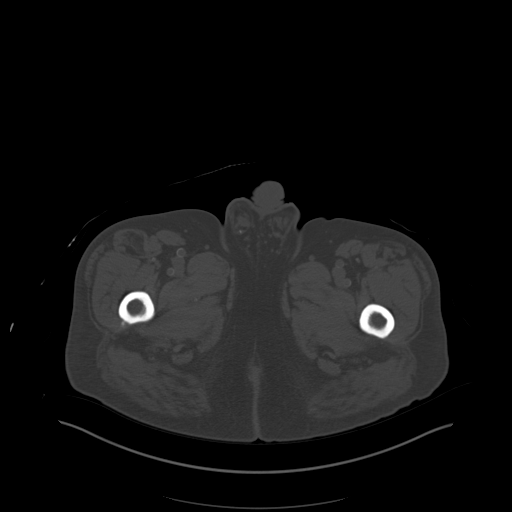
[im 21/119  soft-tissue]
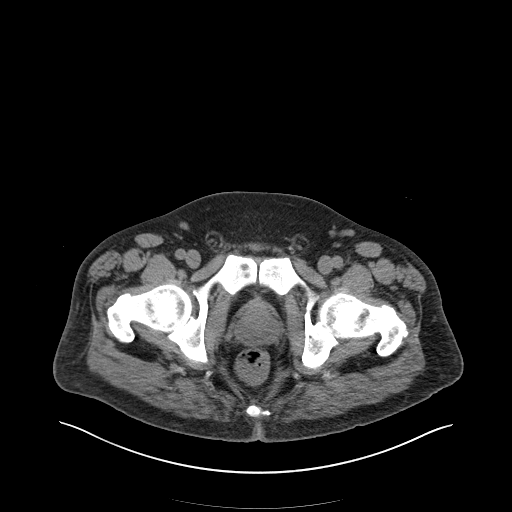
[im 28/119  soft-tissue]
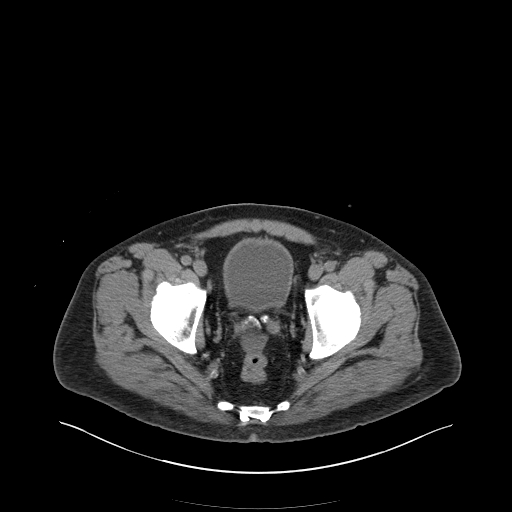
[im 35/119  soft-tissue]
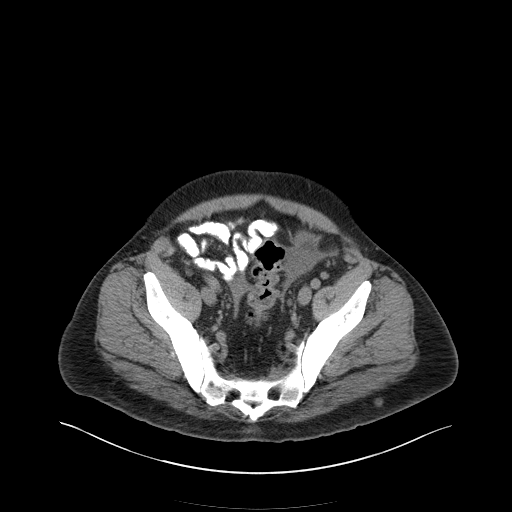
[im 49/119  soft-tissue]
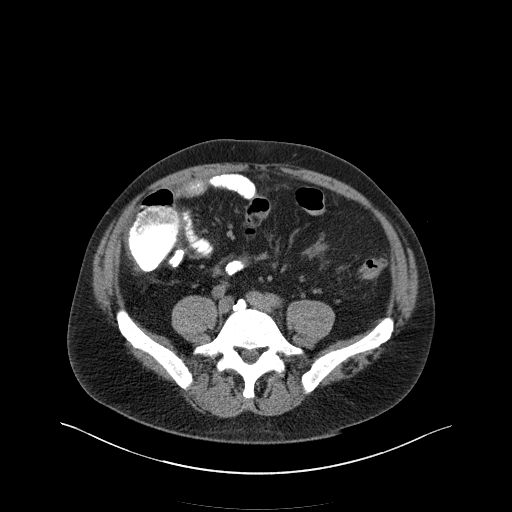
[im 56/119  soft-tissue]
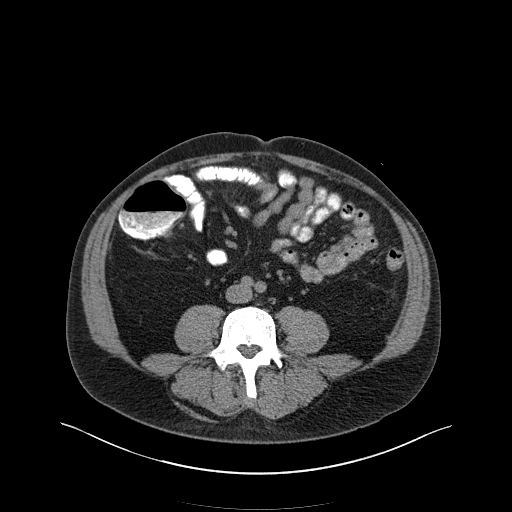
[im 63/119  soft-tissue]
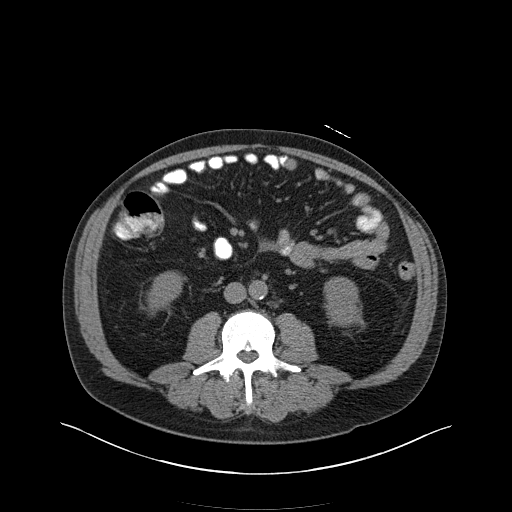
[im 77/119  soft-tissue]
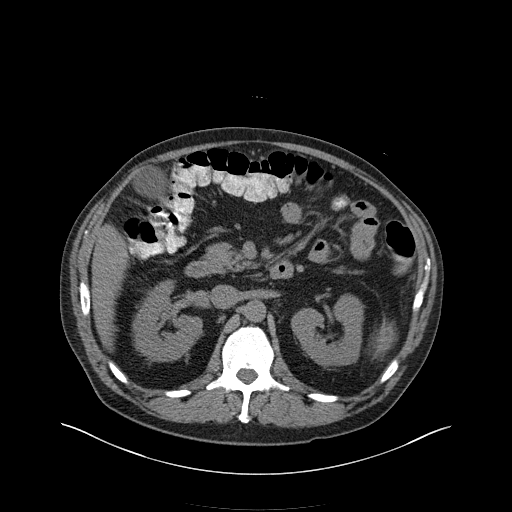
[im 84/119  soft-tissue]
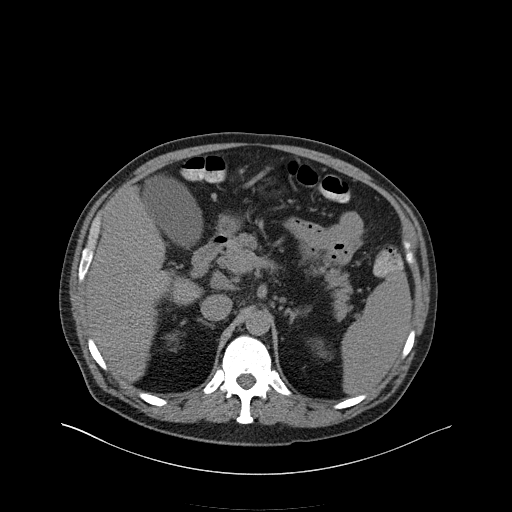
[im 84/119  bone]
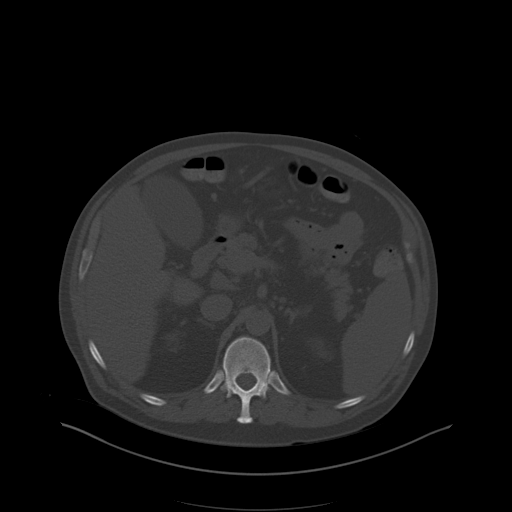
[im 91/119  soft-tissue]
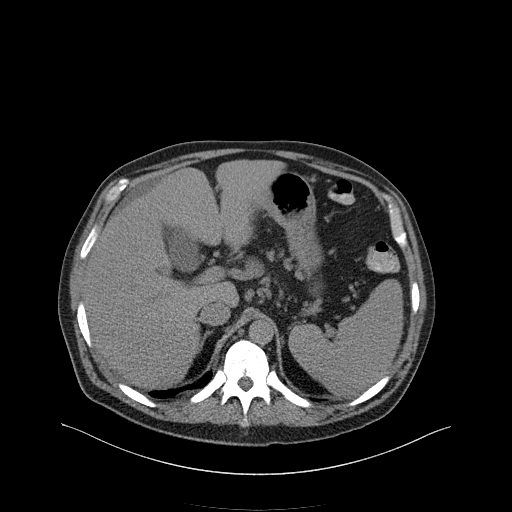
[im 105/119  soft-tissue]
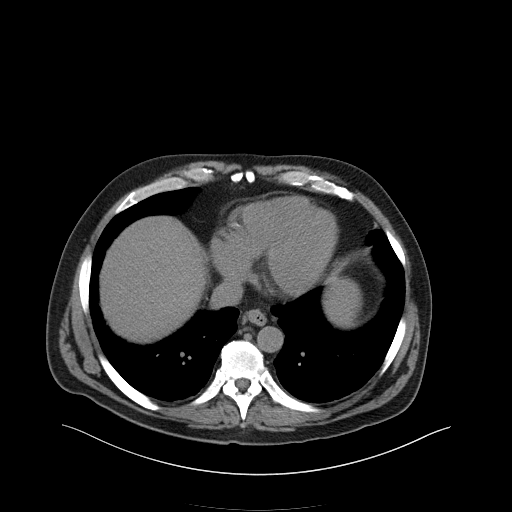
[im 112/119  soft-tissue]
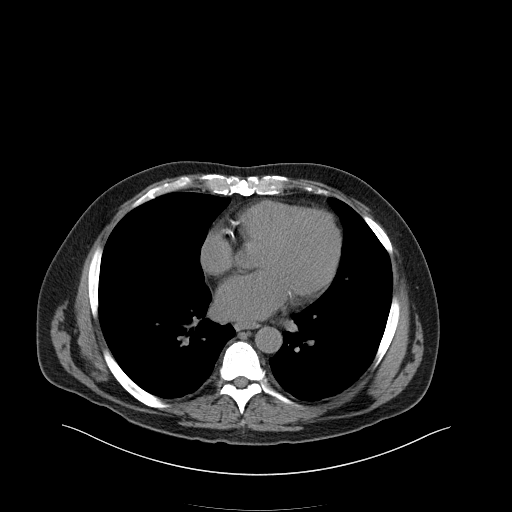

[Series 4: coronal st · coronal · 0.77mm/px · 3 of 114 slices shown]
[im 38/114  soft-tissue]
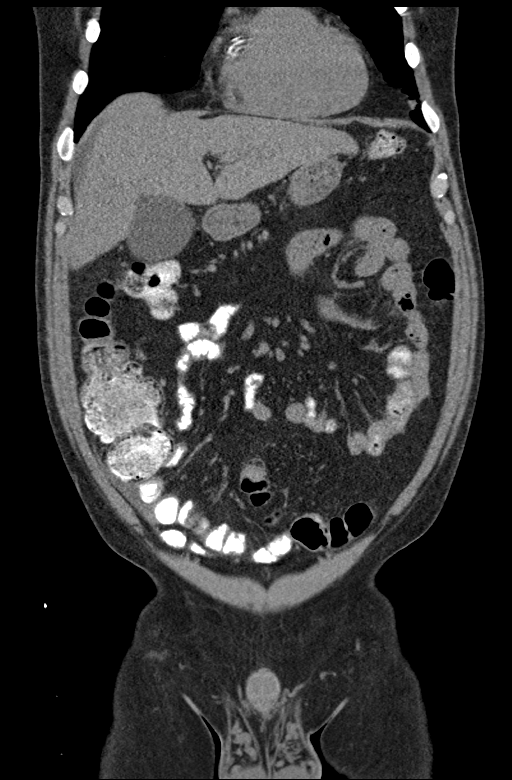
[im 51/114  soft-tissue]
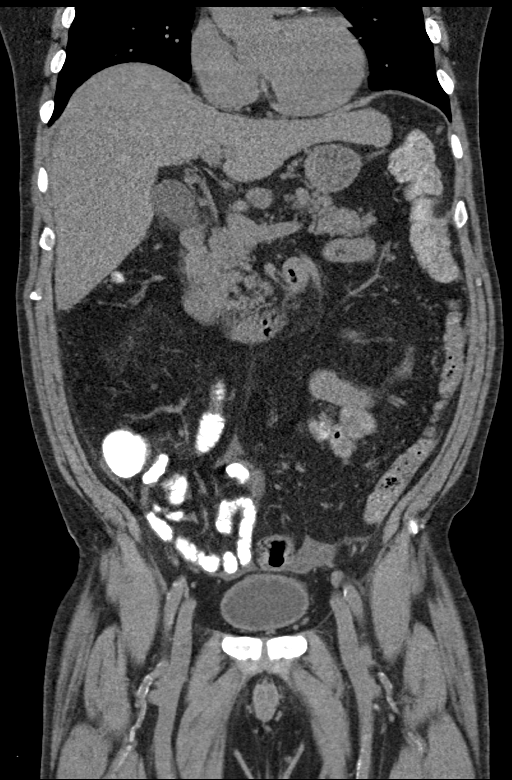
[im 63/114  soft-tissue]
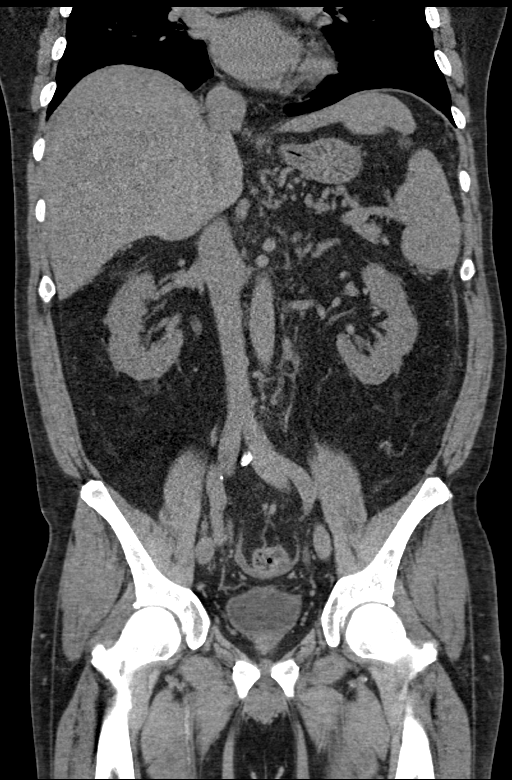

[15 of 46 positions shown; findings below may reference images not displayed]

FINDINGS: Lower chest: There is moderate atherosclerosis of the visualized
coronary arteries. Heart size is normal. Lung bases are clear.

Hepatobiliary: Perihepatic fluid stable since the prior study. There
is mild nodular contour of the liver and prominent caudate lobe,
raising the question of cirrhosis. The gallbladder is distended and
contains calcified stones, layering dependently.

Pancreas: Unremarkable. No pancreatic ductal dilatation or
surrounding inflammatory changes.

Spleen: The spleen is enlarged but homogeneous.

Adrenals/Urinary Tract: Adrenal glands are normal in appearance.
Symmetric size both kidneys. The ureters are unremarkable. No
intrarenal stones. The bladder and visualized portion of the urethra
are normal.

Stomach/Bowel: Stomach has a normal appearance. There has been
improvement in the appearance of small bowel mesenteric edema RIGHT
LOWER QUADRANT. There is persistent mild thickening of the ileal
loops in the RIGHT LOWER QUADRANT and adjacent mesenteric fluid. The
terminal ileum is normal in appearance. Loops of colon are
unremarkable. Appendix is not seen.

Vascular/Lymphatic: There is atherosclerotic calcification of the
abdominal aorta not associated aneurysm. Nonspecific but increased
mesenteric lymph nodes in the RIGHT LOWER QUADRANT. These measure on
the order of 1 centimeter and smaller. A node adjacent to the celiac
axis is 1.6 centimeters, probably unchanged since prior study.

There are numerous LEFT UPPER QUADRANT varices, extending into the
LOWER paraesophageal region.

Reproductive: Prostate is unremarkable.

Other: None

Musculoskeletal: No acute or significant osseous findings.
IMPRESSION: 1. The has been improvement in the amount of mesenteric fluid/edema
in the RIGHT LOWER QUADRANT.
2. There are persistently thickened small bowel loops in the RIGHT
LOWER QUADRANT, sparing the terminal ileum.
3. Findings suggestive of cirrhosis and portal venous hypertension
with it has splenomegaly and LEFT UPPER QUADRANT/LOWER esophageal
varices.
4. Cholelithiasis and distended gallbladder.
5. Nonspecific and possibly reactive mesenteric lymph nodes.
